# Patient Record
Sex: Male | Born: 1965 | Hispanic: Yes | Marital: Single
Health system: Southern US, Community
[De-identification: ages and names within clinical notes are randomized; demographics above are authoritative.]

---

## 2017-08-25 ENCOUNTER — Other Ambulatory Visit: Payer: Self-pay

## 2017-08-25 DIAGNOSIS — J189 Pneumonia, unspecified organism: Secondary | ICD-10-CM | POA: Insufficient documentation

## 2017-08-25 DIAGNOSIS — J101 Influenza due to other identified influenza virus with other respiratory manifestations: Secondary | ICD-10-CM | POA: Insufficient documentation

## 2017-08-25 MED ORDER — ACETAMINOPHEN 325 MG PO TABS
650.0000 mg | ORAL_TABLET | Freq: Once | ORAL | Status: AC
  Administered 2017-08-25: 650 mg via ORAL
  Filled 2017-08-25: qty 2

## 2017-08-25 NOTE — ED Triage Notes (Signed)
Info obtained via [employer] [phone](# [phone]).  I've been feeling bad with cough and cold for past 8 days and now when I cough I'm spitting a lot, reports red color.

## 2017-08-26 ENCOUNTER — Emergency Department
Admission: EM | Admit: 2017-08-26 | Discharge: 2017-08-26 | Disposition: A | Discharge status: Home / Self Care | Payer: Self-pay | Attending: Emergency Medicine | Admitting: Emergency Medicine

## 2017-08-26 ENCOUNTER — Encounter: Payer: Self-pay | Admitting: Radiology

## 2017-08-26 ENCOUNTER — Emergency Department: Payer: Self-pay

## 2017-08-26 DIAGNOSIS — J029 Acute pharyngitis, unspecified: Secondary | ICD-10-CM

## 2017-08-26 DIAGNOSIS — R509 Fever, unspecified: Secondary | ICD-10-CM

## 2017-08-26 DIAGNOSIS — J189 Pneumonia, unspecified organism: Secondary | ICD-10-CM

## 2017-08-26 DIAGNOSIS — J101 Influenza due to other identified influenza virus with other respiratory manifestations: Secondary | ICD-10-CM

## 2017-08-26 LAB — CBC
HCT: 50.2 % (ref 40.0–52.0)
Hemoglobin: 17.2 g/dL (ref 13.0–18.0)
MCH: 31.4 pg (ref 26.0–34.0)
MCHC: 34.3 g/dL (ref 32.0–36.0)
MCV: 91.4 fL (ref 80.0–100.0)
PLATELETS: 171 10*3/uL (ref 150–440)
RBC: 5.49 MIL/uL (ref 4.40–5.90)
RDW: 14.4 % (ref 11.5–14.5)
WBC: 6.8 10*3/uL (ref 3.8–10.6)

## 2017-08-26 LAB — COMPREHENSIVE METABOLIC PANEL
ALT: 51 U/L (ref 17–63)
AST: 64 U/L — ABNORMAL HIGH (ref 15–41)
Albumin: 4.3 g/dL (ref 3.5–5.0)
Alkaline Phosphatase: 52 U/L (ref 38–126)
Anion gap: 15 (ref 5–15)
BILIRUBIN TOTAL: 0.8 mg/dL (ref 0.3–1.2)
BUN: 16 mg/dL (ref 6–20)
CO2: 24 mmol/L (ref 22–32)
CREATININE: 1.21 mg/dL (ref 0.61–1.24)
Calcium: 8.7 mg/dL — ABNORMAL LOW (ref 8.9–10.3)
Chloride: 95 mmol/L — ABNORMAL LOW (ref 101–111)
Glucose, Bld: 150 mg/dL — ABNORMAL HIGH (ref 65–99)
Potassium: 4.1 mmol/L (ref 3.5–5.1)
Sodium: 134 mmol/L — ABNORMAL LOW (ref 135–145)
TOTAL PROTEIN: 8.3 g/dL — AB (ref 6.5–8.1)

## 2017-08-26 LAB — LACTIC ACID, PLASMA: LACTIC ACID, VENOUS: 1.6 mmol/L (ref 0.5–1.9)

## 2017-08-26 LAB — URINALYSIS, COMPLETE (UACMP) WITH MICROSCOPIC
BILIRUBIN URINE: NEGATIVE
Bacteria, UA: NONE SEEN
GLUCOSE, UA: NEGATIVE mg/dL
KETONES UR: NEGATIVE mg/dL
LEUKOCYTES UA: NEGATIVE
NITRITE: NEGATIVE
Protein, ur: 100 mg/dL — AB
SPECIFIC GRAVITY, URINE: 1.02 (ref 1.005–1.030)
pH: 5 (ref 5.0–8.0)

## 2017-08-26 LAB — GROUP A STREP BY PCR: Group A Strep by PCR: NOT DETECTED

## 2017-08-26 LAB — INFLUENZA PANEL BY PCR (TYPE A & B)
INFLAPCR: POSITIVE — AB
Influenza B By PCR: NEGATIVE

## 2017-08-26 LAB — LIPASE, BLOOD: LIPASE: 45 U/L (ref 11–51)

## 2017-08-26 MED ORDER — SODIUM CHLORIDE 0.9 % IV SOLN
500.0000 mg | INTRAVENOUS | Status: DC
  Administered 2017-08-26: 500 mg via INTRAVENOUS
  Filled 2017-08-26: qty 500

## 2017-08-26 MED ORDER — MAGIC MOUTHWASH: 5.0000 mL | Freq: Three times a day (TID) | ORAL | 0 refills | Status: AC | PRN

## 2017-08-26 MED ORDER — CEFTRIAXONE SODIUM 1 G IJ SOLR
INTRAMUSCULAR | Status: AC
  Administered 2017-08-26: 1 g via INTRAVENOUS
  Filled 2017-08-26: qty 10

## 2017-08-26 MED ORDER — HYDROCODONE-ACETAMINOPHEN 7.5-325 MG/15ML PO SOLN
10.0000 mL | Freq: Once | ORAL | Status: AC
  Administered 2017-08-26: 10 mL via ORAL
  Filled 2017-08-26: qty 15

## 2017-08-26 MED ORDER — KETOROLAC TROMETHAMINE 30 MG/ML IJ SOLN
10.0000 mg | Freq: Once | INTRAMUSCULAR | Status: AC
Start: 2017-08-26 — End: 2017-08-26
  Administered 2017-08-26: 9.9 mg via INTRAVENOUS
  Filled 2017-08-26: qty 1

## 2017-08-26 MED ORDER — SODIUM CHLORIDE 0.9 % IV BOLUS
1000.0000 mL | Freq: Once | INTRAVENOUS | Status: AC
  Administered 2017-08-26: 1000 mL via INTRAVENOUS

## 2017-08-26 MED ORDER — SODIUM CHLORIDE 0.9 % IV BOLUS (SEPSIS)
1000.0000 mL | Freq: Once | INTRAVENOUS | Status: AC
  Administered 2017-08-26: 1000 mL via INTRAVENOUS

## 2017-08-26 MED ORDER — MAGIC MOUTHWASH
10.0000 mL | Freq: Once | ORAL | Status: AC
  Administered 2017-08-26: 10 mL via ORAL
  Filled 2017-08-26: qty 10

## 2017-08-26 MED ORDER — IOPAMIDOL (ISOVUE-370) INJECTION 76%
75.0000 mL | Freq: Once | INTRAVENOUS | Status: AC | PRN
  Administered 2017-08-26: 75 mL via INTRAVENOUS

## 2017-08-26 MED ORDER — AZITHROMYCIN 250 MG PO TABS: 250.0000 mg | ORAL_TABLET | Freq: Every day | ORAL | 0 refills | Status: AC

## 2017-08-26 MED ORDER — SODIUM CHLORIDE 0.9 % IV SOLN
2.0000 g | INTRAVENOUS | Status: DC
  Filled 2017-08-26: qty 20

## 2017-08-26 MED ORDER — SODIUM CHLORIDE 0.9 % IV BOLUS (SEPSIS): 500.0000 mL | Freq: Once | INTRAVENOUS | Status: DC

## 2017-08-26 MED ORDER — SODIUM CHLORIDE 0.9 % IV SOLN
1.0000 g | Freq: Once | INTRAVENOUS | Status: AC
  Administered 2017-08-26: 1 g via INTRAVENOUS

## 2017-08-26 MED ORDER — AMOXICILLIN-POT CLAVULANATE 875-125 MG PO TABS: 1.0000 | ORAL_TABLET | Freq: Two times a day (BID) | ORAL | 0 refills | Status: AC

## 2017-08-26 NOTE — ED Provider Notes (Signed)
[facility] Emergency Department Provider Note   ____________________________________________   First MD Initiated Contact with Patient 08/26/17 0244     (approximate)  I have reviewed the triage vital signs and the nursing notes.   HISTORY  Chief Complaint Abdominal Pain and Emesis  History obtained Via [employer] interpreter  HPI Gary Barnes is a 52 y.o. male who presents to the ED from home with a chief complaint of fever, sore throat, cough and body aches.  Symptoms for the past 8 days.  Complains of sore throat and nonproductive cough.  Body aches, nausea/vomiting/diarrhea last yesterday.  Denies headache, vision changes, neck pain, chest pain, shortness of breath, abdominal pain, dysuria.  Denies sick contacts.  Denies recent travel or trauma.   Past medical history None  There are no active problems to display for this patient.     Prior to Admission medications   Not on File    Allergies Patient has no allergy information on record.  No family history on file.  Social History Social History   Tobacco Use  . Smoking status: Not on file  Substance Use Topics  . Alcohol use: Not on file  . Drug use: Not on file  No recent alcohol use  Review of Systems  Constitutional: Positive for body aches and fever/chills Eyes: No visual changes. ENT: Positive for sore throat. Cardiovascular: Denies chest pain. Respiratory: Positive for nonproductive cough.  Denies shortness of breath. Gastrointestinal: No abdominal pain.  Positive for nausea, vomiting and diarrhea.  No constipation. Genitourinary: Negative for dysuria. Musculoskeletal: Negative for back pain. Skin: Negative for rash. Neurological: Negative for headaches, focal weakness or numbness.   ____________________________________________   PHYSICAL EXAM:  VITAL SIGNS: ED Triage Vitals  Enc Vitals Group     BP 08/25/17 2344 138/87     Pulse Rate 08/25/17 2344 (!)  131     Resp 08/25/17 2344 20     Temp 08/25/17 2344 (!) 103.2 F (39.6 C)     Temp Source 08/25/17 2344 Oral     SpO2 08/25/17 2344 94 %     Weight 08/25/17 2340 242 lb 8.1 oz (110 kg)     Height --      Head Circumference --      Peak Flow --      Pain Score --      Pain Loc --      Pain Edu? --      Excl. in GC? --     Constitutional: Alert and oriented. Well appearing and in mild acute distress. Eyes: Conjunctivae are normal. PERRL. EOMI. Head: Atraumatic. Nose: No congestion/rhinnorhea. Mouth/Throat: Mucous membranes are moist.  Oropharynx erythematous without tonsillar swelling, exudates or peritonsillar abscess.  There is no hoarse or muffled voice.  There is no drooling. Neck: No stridor.  Supple neck without meningismus. Hematological/Lymphatic/Immunilogical: No cervical lymphadenopathy. Cardiovascular: Tachycardic rate, regular rhythm. Grossly normal heart sounds.  Good peripheral circulation. Respiratory: Normal respiratory effort.  No retractions. Lungs CTAB. Gastrointestinal: Soft and nontender to light or deep palpation. No distention. No abdominal bruits. No CVA tenderness. Musculoskeletal: No lower extremity tenderness nor edema.  No joint effusions. Neurologic:  Normal speech and language. No gross focal neurologic deficits are appreciated. No gait instability. Skin:  Skin is warm, dry and intact. No rash noted.  No petechiae. Psychiatric: Mood and affect are normal. Speech and behavior are normal.  ____________________________________________   LABS (all labs ordered are listed, but only abnormal results are  displayed)  Labs Reviewed  COMPREHENSIVE METABOLIC PANEL - Abnormal; Notable for the following components:      Result Value   Sodium 134 (*)    Chloride 95 (*)    Glucose, Bld 150 (*)    Calcium 8.7 (*)    Total Protein 8.3 (*)    AST 64 (*)    All other components within normal limits  URINALYSIS, COMPLETE (UACMP) WITH MICROSCOPIC - Abnormal;  Notable for the following components:   Color, Urine YELLOW (*)    APPearance CLEAR (*)    Hgb urine dipstick SMALL (*)    Protein, ur 100 (*)    All other components within normal limits  INFLUENZA PANEL BY PCR (TYPE A & B) - Abnormal; Notable for the following components:   Influenza A By PCR POSITIVE (*)    All other components within normal limits  GROUP A STREP BY PCR  CULTURE, BLOOD (ROUTINE X 2)  CULTURE, BLOOD (ROUTINE X 2)  URINE CULTURE  LIPASE, BLOOD  CBC  LACTIC ACID, PLASMA  LACTIC ACID, PLASMA   ____________________________________________  EKG  None ____________________________________________  RADIOLOGY  ED MD interpretation:  CAP; CT no emboli  Official radiology report(s): Dg Chest 2 View  Result Date: 08/26/2017 CLINICAL DATA:  Fever and cough EXAM: CHEST - 2 VIEW COMPARISON:  None. FINDINGS: Top-normal size heart. No aortic aneurysm. Patchy subpleural ill-defined areas of airspace opacity suspicious for multifocal foci of pneumonia are identified. As some of these foci appear somewhat rounded and potentially cavitary, the possibility of septic emboli is not excluded. No suspicious osseous abnormality. IMPRESSION: Multifocal ill-defined airspace opacities some of which appear slightly rounded and potentially cavitary. Findings are suspicious for multifocal pneumonia or potentially septic emboli. Clinical correlation and follow-up to resolution recommended. Electronically Signed   By: Tollie Eth M.D.   On: 08/26/2017 03:15   Ct Angio Chest Pe W/cm &/or Wo Cm  Result Date: 08/26/2017 CLINICAL DATA:  Cough and fever. EXAM: CT ANGIOGRAPHY CHEST WITH CONTRAST TECHNIQUE: Multidetector CT imaging of the chest was performed using the standard protocol during bolus administration of intravenous contrast. Multiplanar CT image reconstructions and MIPs were obtained to evaluate the vascular anatomy. CONTRAST:  75mL ISOVUE-370 IOPAMIDOL (ISOVUE-370) INJECTION 76%  COMPARISON:  Chest radiograph earlier this day FINDINGS: Cardiovascular: There are no filling defects within the pulmonary arteries to the segmental level to suggest pulmonary embolus. Subsegmental branches cannot be assessed due to contrast bolus timing and soft tissue attenuation from habitus. Thoracic aorta is normal in caliber without dissection. Heart is normal in size. No pericardial effusion. Mediastinum/Nodes: Prominent right hilar node measures 10 mm. Small paratracheal nodes all subcentimeter short axis. Tiny hiatal hernia, esophagus is otherwise decompressed. No thyroid nodule. Lungs/Pleura: Multifocal ground-glass opacities throughout all lobes of both lungs. There is slight peripheral and basilar predominant distribution, however central and paramediastinal opacities are also seen. No cavitation. Mild central bronchial thickening. No pleural effusion. Upper Abdomen: Liver is prominent size, possible steatosis. Musculoskeletal: There are no acute or suspicious osseous abnormalities. Review of the MIP images confirms the above findings. IMPRESSION: 1. No pulmonary embolus. 2. Multifocal ground-glass opacities throughout both lungs. There is no cavitary component. Findings may represent infectious, inflammatory, seen with eosinophilic or organizing pneumonia. There is mild central bronchial thickening. 3. Small hiatal hernia. Electronically Signed   By: Rubye Oaks M.D.   On: 08/26/2017 06:13    ____________________________________________   PROCEDURES  Procedure(s) performed: None  Procedures  Critical Care performed:  No  ____________________________________________   INITIAL IMPRESSION / ASSESSMENT AND PLAN / ED COURSE  As part of my medical decision making, I reviewed the following data within the electronic MEDICAL RECORD NUMBER Nursing notes reviewed and incorporated, Labs reviewed, Old chart reviewed, Radiograph reviewed and Notes from prior ED visits   52 year old male who  presents with fever, sore throat, cough and body aches for the past 8 days.  Differential diagnosis includes but is not limited to pneumonia, strep throat, viral syndrome, sepsis, etc.   Laboratory results remarkable for normal WBC.  Repeat temperature 100 F orally.  Pulse rate has come down to 106.  Will check blood cultures, lactate, influenza, strep.  Will initiate IV fluid resuscitation, 10 mg IV Toradol for body aches, oral Lortab for sore throat and reassess.   Clinical Course as of Aug 26 624  Mon Aug 26, 2017  [id number] Influenza A noted.  Lactate is normal.  Multifocal pneumonia versus septic emboli on chest x-ray.  Will obtain CT chest.  ED code sepsis initiated.   [JS]    Clinical Course User Index [JS] Irean HongSung, Jade J, MD     ____________________________________________   FINAL CLINICAL IMPRESSION(S) / ED DIAGNOSES  Final diagnoses:  Fever, unspecified fever cause  Influenza A  Community acquired pneumonia, unspecified laterality  Sore throat     ED Discharge Orders    None       Note:  This document was prepared using Dragon voice recognition software and may include unintentional dictation errors.    Irean HongSung, Jade J, MD 08/26/17 [phone]

## 2017-08-26 NOTE — Discharge Instructions (Signed)
Alternate Tylenol and ibuprofen every 4 hours as needed for fever greater than 100.4 F. Take both antibiotics as prescribed: Augmentin 875 mg twice daily for 7 days Azithromycin 250 mg daily x4 days You may use Magic mouthwash as needed for throat discomfort. Return to the ER for worsening symptoms, persistent vomiting, difficulty breathing or other concerns.

## 2017-08-26 NOTE — Progress Notes (Signed)
CODE SEPSIS - PHARMACY COMMUNICATION  **Broad Spectrum Antibiotics should be administered within 1 hour of Sepsis diagnosis**  Time Code Sepsis Called/Page Received: 0402  Antibiotics Ordered: azithromycin/ceftriaxone  Time of 1st antibiotic administration: 0408  Additional action taken by pharmacy:   If necessary, Name of Provider/Nurse Contacted:     Thomasene Rippleavid  Millisa Giarrusso ,PharmD Clinical Pharmacist  08/26/2017  4:12 AM

## 2017-08-26 NOTE — ED Notes (Signed)
This RN and Dr Dolores FrameSung into room to assess pt. Via interpreter [phone] pt reports fever and sore throat for 8 days. States unable to eat or drink. Pt reports cough and has had some blood in the mucous. Pt speaking in full and complete sentences with no difficulty at this time. Resp even and unlabored. Lungs clear. Throat red and swollen.

## 2017-08-27 ENCOUNTER — Emergency Department: Admission: EM | Admit: 2017-08-27 | Discharge: 2017-08-27 | Discharge status: Absent without leave | Payer: Self-pay

## 2017-08-27 LAB — URINE CULTURE: Culture: NO GROWTH

## 2017-08-27 NOTE — ED Notes (Signed)
[facility] [address] called asking for clarification of magic mouthwash recipe.  Per dr Lenard Lancepaduchowski can use benadryl, maalox and lidocaine 1:1:1 with same instructions.

## 2017-08-27 NOTE — ED Notes (Signed)
No answer when called several times from lobby 

## 2017-08-27 NOTE — ED Notes (Signed)
ED Provider at bedside. 

## 2017-08-31 LAB — CULTURE, BLOOD (ROUTINE X 2)
CULTURE: NO GROWTH
Culture: NO GROWTH
Special Requests: ADEQUATE

## 2019-06-08 IMAGING — CT CT ANGIO CHEST
2 of 6 series · 18 of 46 positions shown · IV contrast (APPLIED)
Comparison: Chest radiograph earlier this day

CLINICAL DATA: Cough and fever.

EXAM:
CT ANGIOGRAPHY CHEST WITH CONTRAST
TECHNIQUE: Multidetector CT imaging of the chest was performed using the
standard protocol during bolus administration of intravenous
contrast. Multiplanar CT image reconstructions and MIPs were
obtained to evaluate the vascular anatomy.
CONTRAST:  75mL [id number] IOPAMIDOL ([id number]) INJECTION 76%

[Series 6: thins · axial · 0.77mm/px · z∈[-859,-582]mm · 15 of 305 slices shown]
[im 14/305  lung]
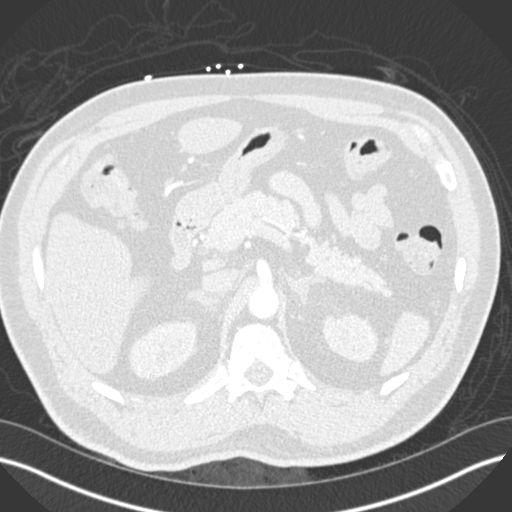
[im 40/305  soft-tissue]
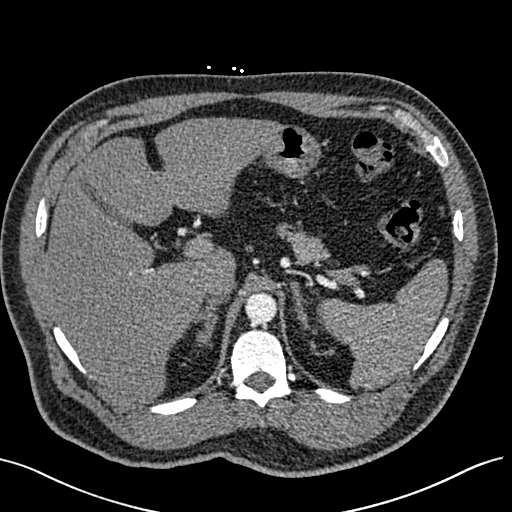
[im 53/305  lung]
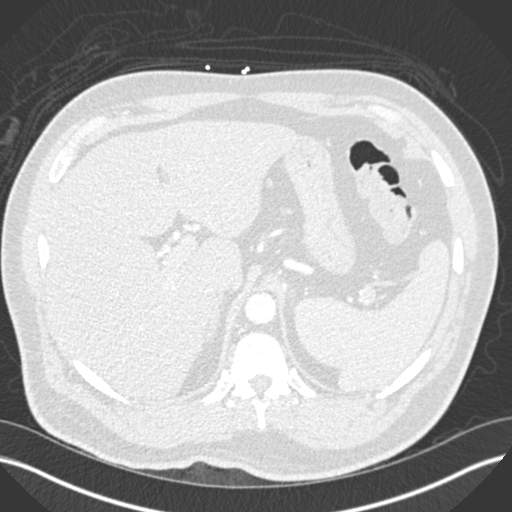
[im 80/305  soft-tissue]
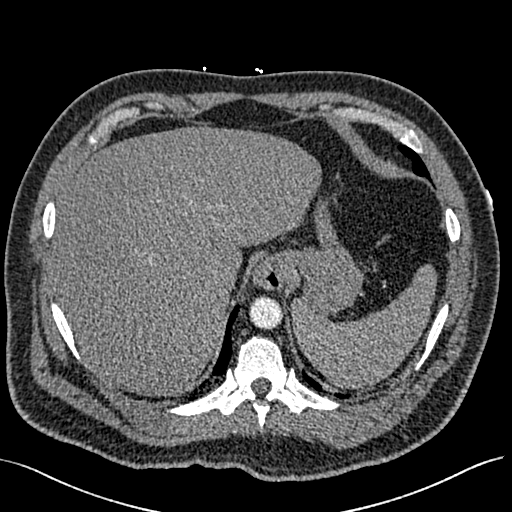
[im 93/305  lung]
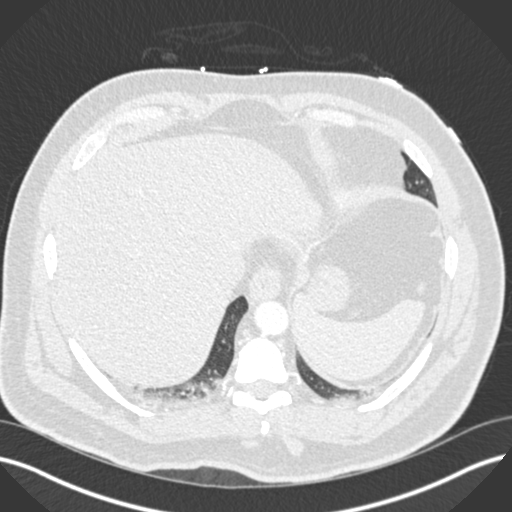
[im 119/305  soft-tissue]
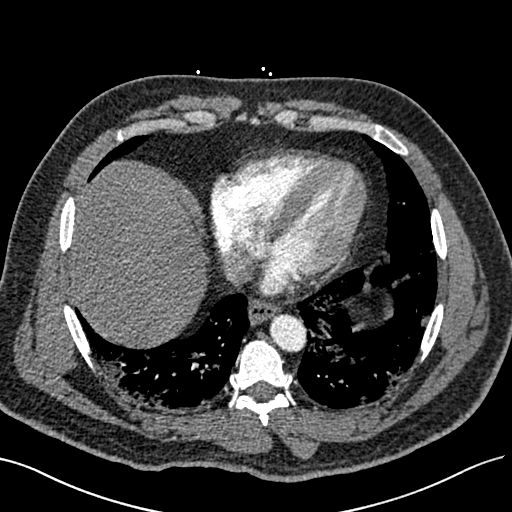
[im 133/305  lung]
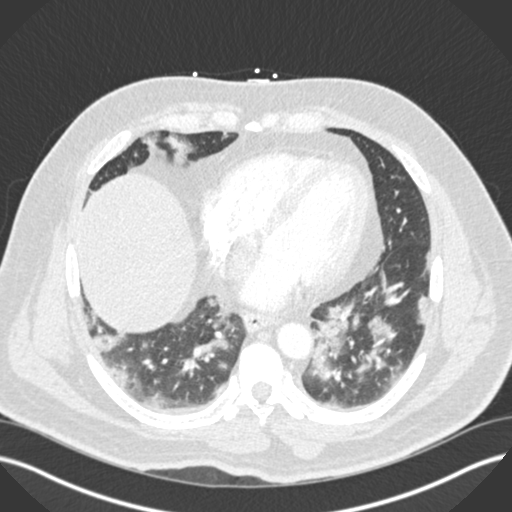
[im 159/305  soft-tissue]
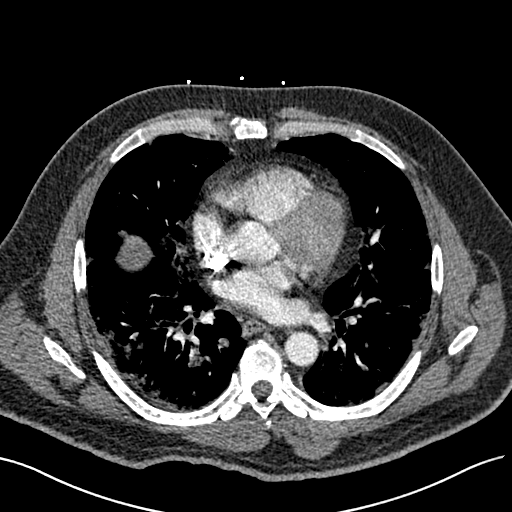
[im 172/305  lung]
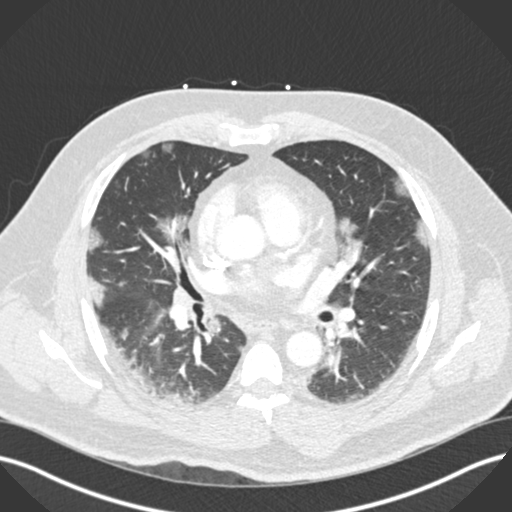
[im 186/305  soft-tissue]
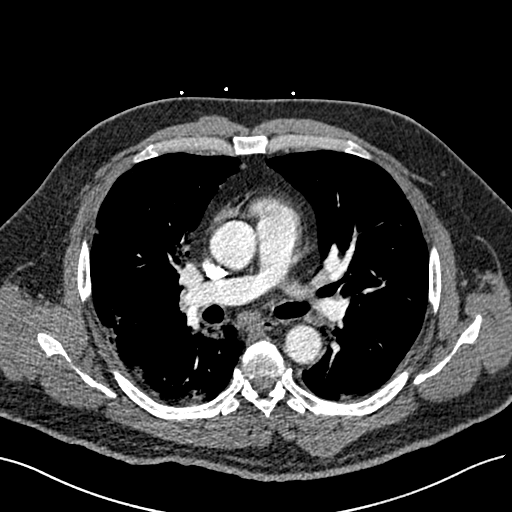
[im 212/305  lung]
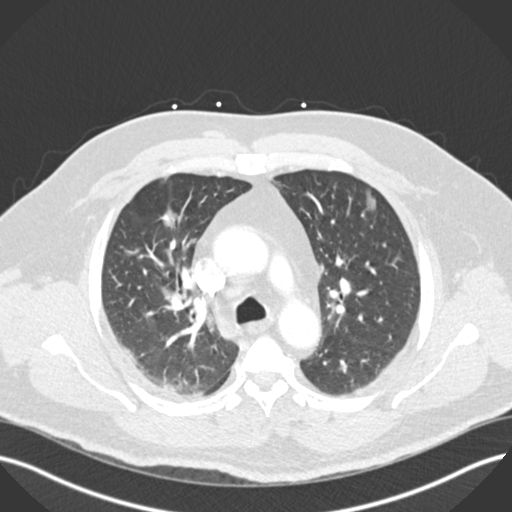
[im 225/305  soft-tissue]
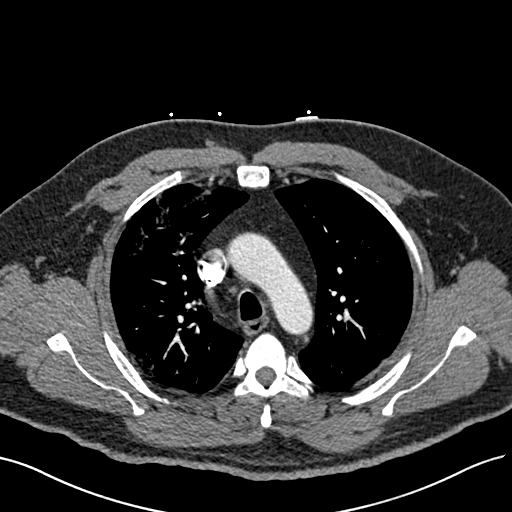
[im 252/305  lung]
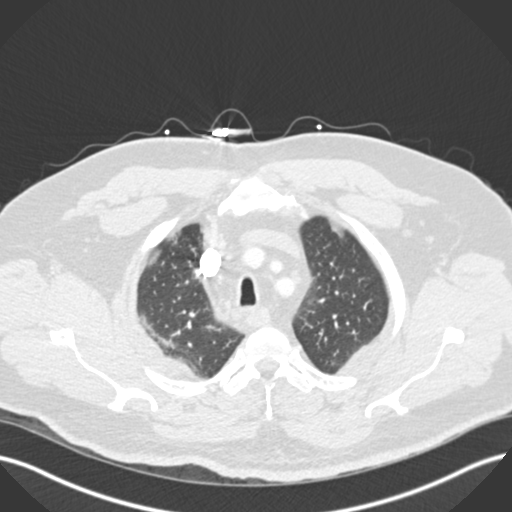
[im 265/305  soft-tissue]
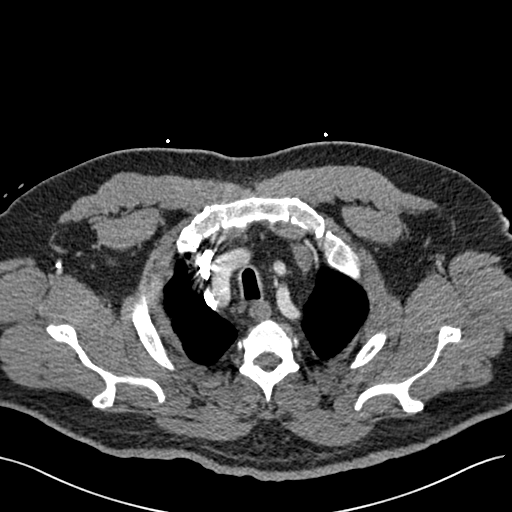
[im 291/305  lung]
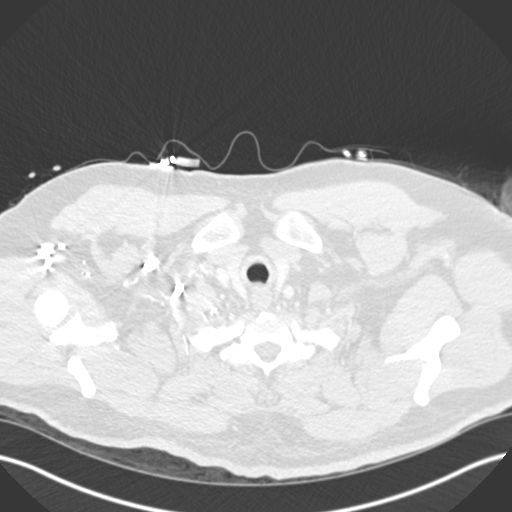

[Series 8: coronal mpr · coronal · 0.60mm/px · 3 of 99 slices shown]
[im 25/99  soft-tissue]
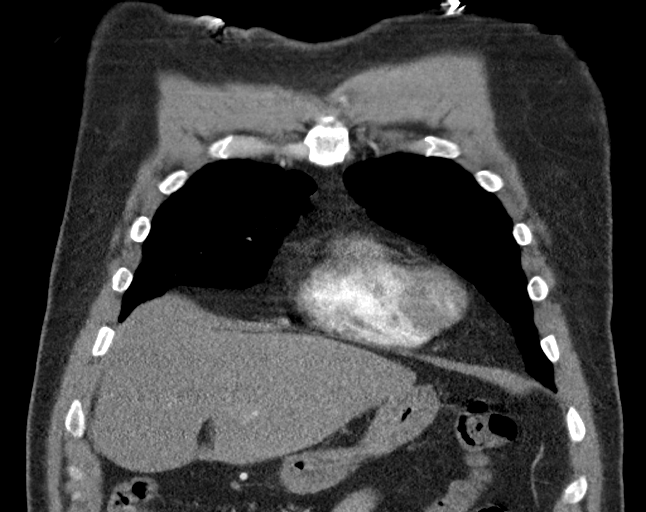
[im 50/99  soft-tissue]
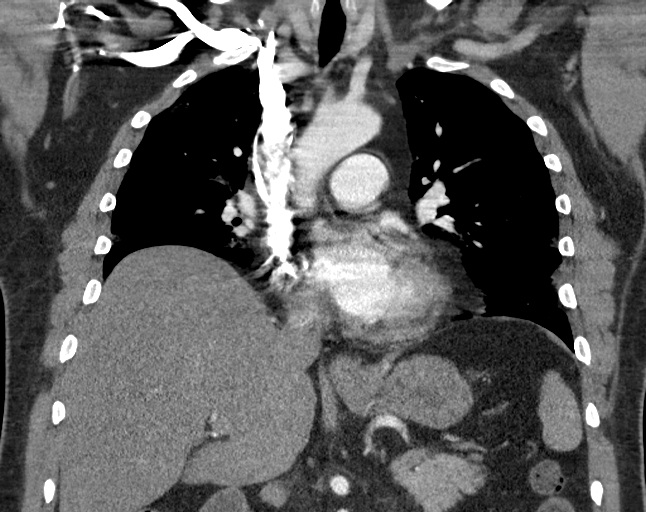
[im 74/99  soft-tissue]
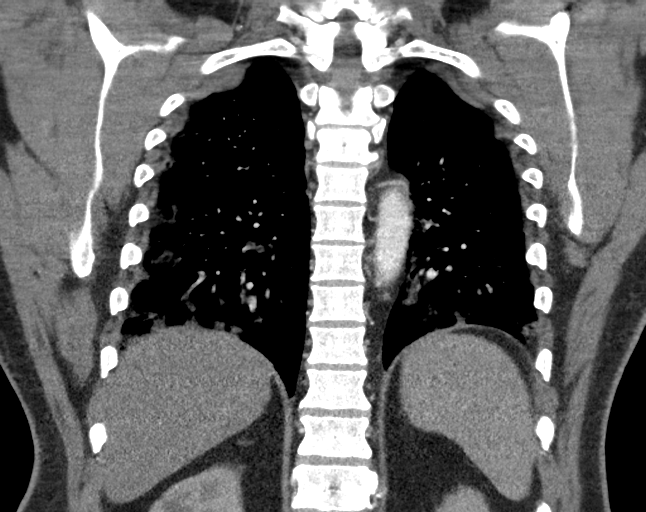

[18 of 46 positions shown; findings below may reference images not displayed]

FINDINGS: Cardiovascular: There are no filling defects within the pulmonary
arteries to the segmental level to suggest pulmonary embolus.
Subsegmental branches cannot be assessed due to contrast bolus
timing and soft tissue attenuation from habitus. Thoracic aorta is
normal in caliber without dissection. Heart is normal in size. No
pericardial effusion.

Mediastinum/Nodes: Prominent right hilar node measures 10 mm. Small
paratracheal nodes all subcentimeter short axis. Tiny hiatal hernia,
esophagus is otherwise decompressed. No thyroid nodule.

Lungs/Pleura: Multifocal ground-glass opacities throughout all lobes
of both lungs. There is slight peripheral and basilar predominant
distribution, however central and paramediastinal opacities are also
seen. No cavitation. Mild central bronchial thickening. No pleural
effusion.

Upper Abdomen: Liver is prominent size, possible steatosis.

Musculoskeletal: There are no acute or suspicious osseous
abnormalities.

Review of the MIP images confirms the above findings.
IMPRESSION: 1. No pulmonary embolus.
2. Multifocal ground-glass opacities throughout both lungs. There is
no cavitary component. Findings may represent infectious,
inflammatory, seen with eosinophilic or organizing pneumonia. There
is mild central bronchial thickening.
3. Small hiatal hernia.

## 2019-06-08 IMAGING — CR DG CHEST 2V
2 series · 2 of 2 positions shown · non-contrast
Comparison: None.

CLINICAL DATA: Fever and cough

EXAM:
CHEST - 2 VIEW

[chest lat]
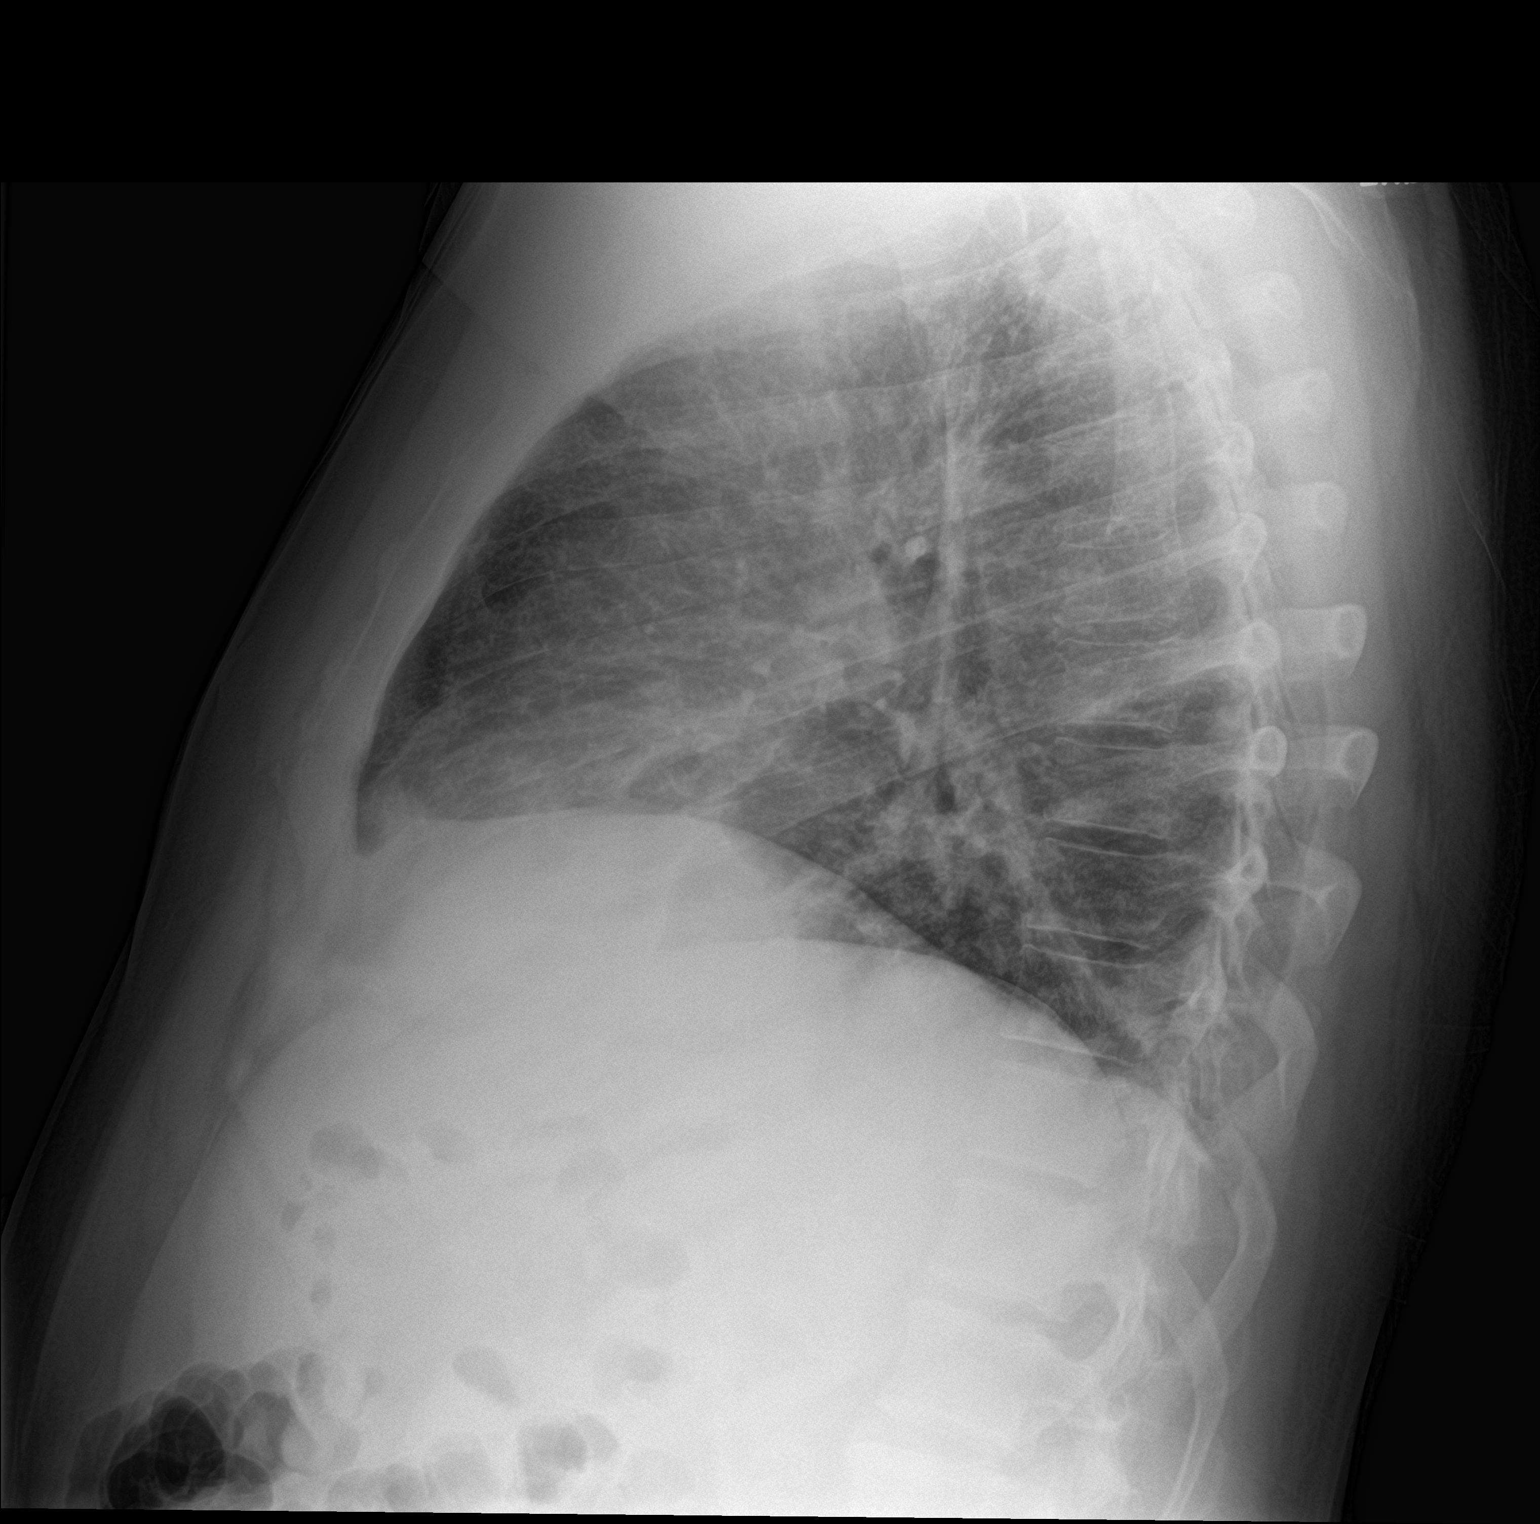

[chest pa]
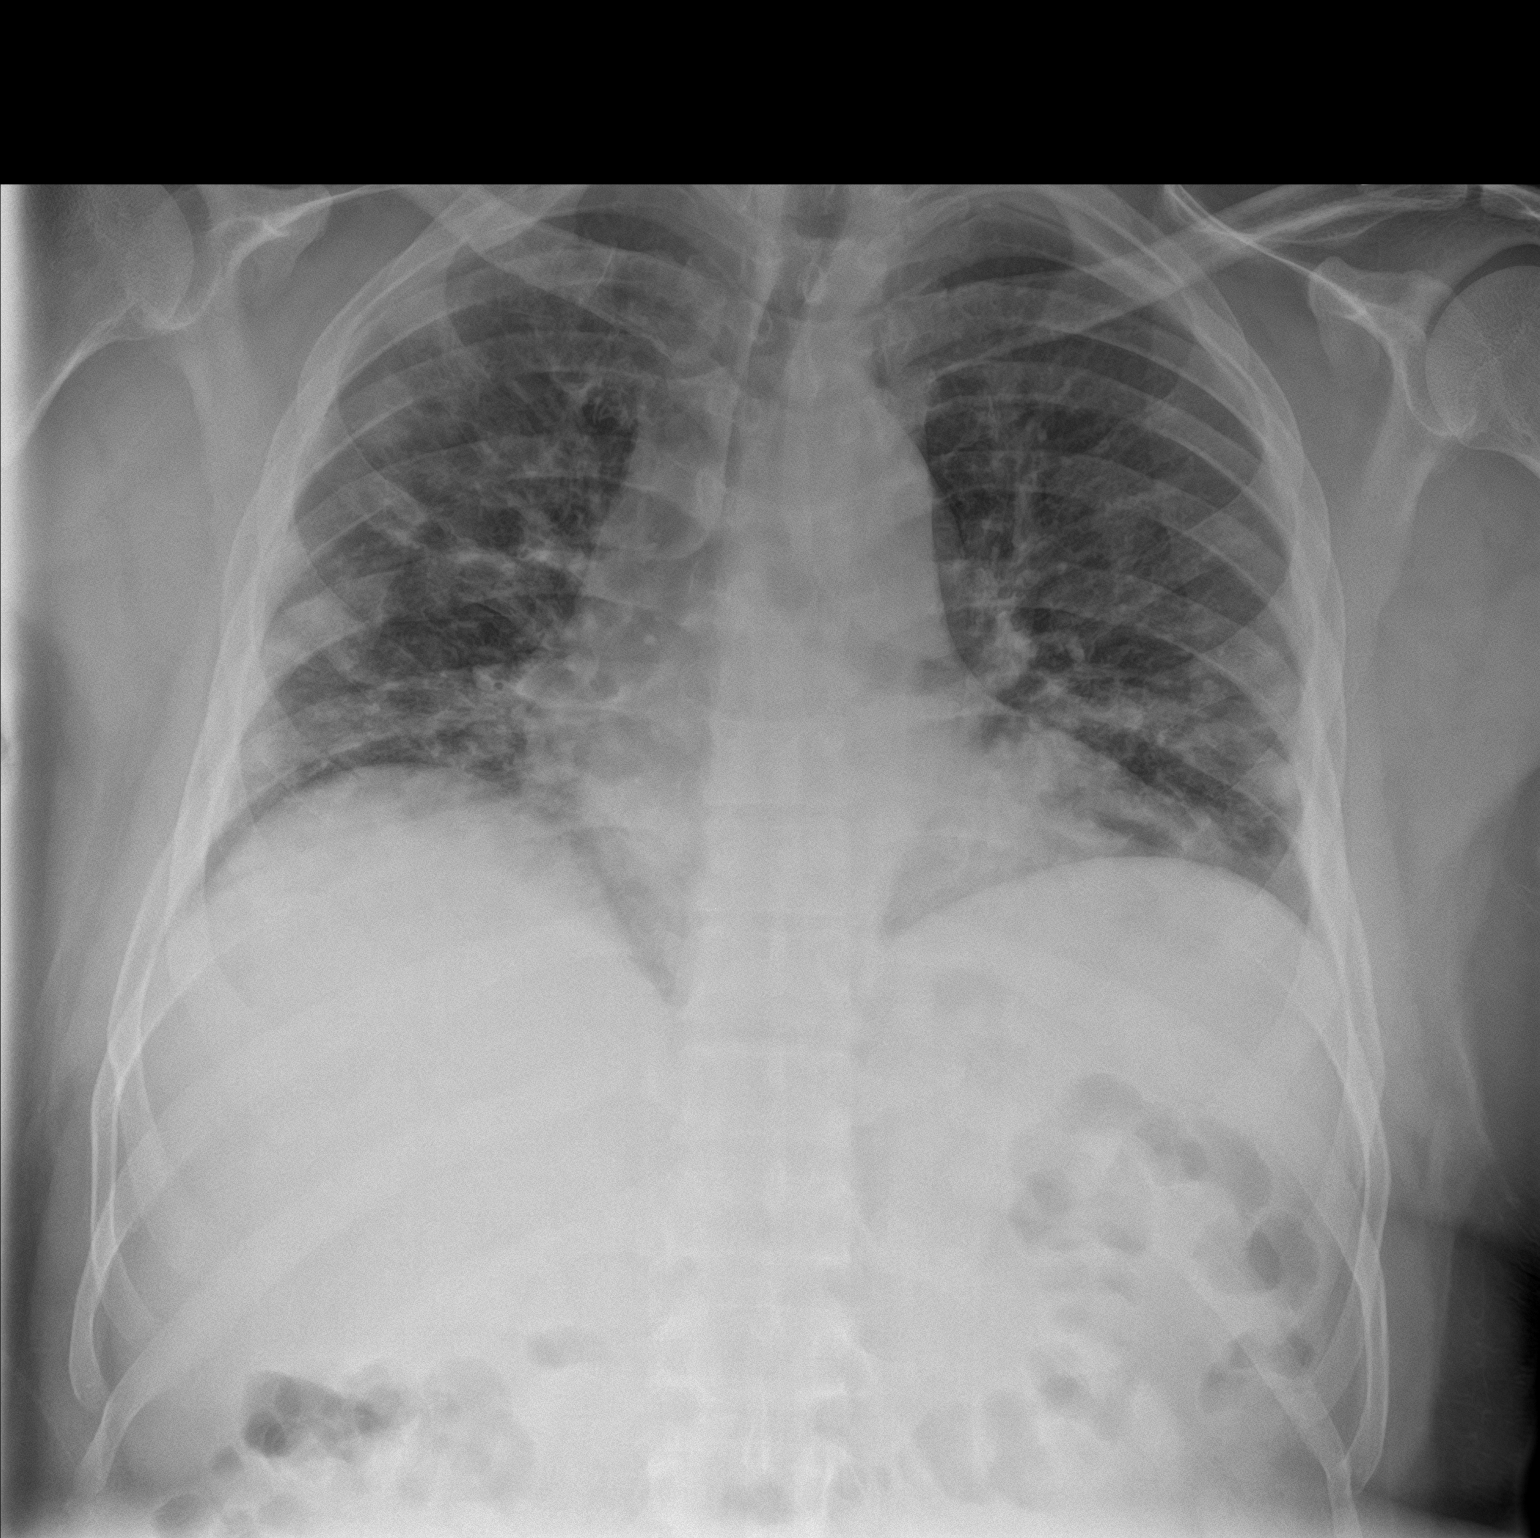

[2 of 2 positions shown; findings below may reference images not displayed]

FINDINGS: Top-normal size heart. No aortic aneurysm. Patchy subpleural
ill-defined areas of airspace opacity suspicious for multifocal foci
of pneumonia are identified. As some of these foci appear somewhat
rounded and potentially cavitary, the possibility of septic emboli
is not excluded. No suspicious osseous abnormality.
IMPRESSION: Multifocal ill-defined airspace opacities some of which appear
slightly rounded and potentially cavitary. Findings are suspicious
for multifocal pneumonia or potentially septic emboli. Clinical
correlation and follow-up to resolution recommended.
